# Patient Record
Sex: Female | Born: 1950 | Race: White | Hispanic: No | Marital: Married | Smoking: Never smoker
Health system: Southern US, Community
[De-identification: ages and names within clinical notes are randomized; demographics above are authoritative.]

## PROBLEM LIST (undated history)

## (undated) HISTORY — PX: AUGMENTATION MAMMAPLASTY: SUR837

---

## 1999-03-05 ENCOUNTER — Encounter: Admission: RE | Admit: 1999-03-05 | Discharge: 1999-03-05 | Payer: Self-pay | Admitting: Obstetrics and Gynecology

## 1999-03-05 ENCOUNTER — Other Ambulatory Visit: Admission: RE | Admit: 1999-03-05 | Discharge: 1999-03-05 | Payer: Self-pay | Admitting: Obstetrics and Gynecology

## 1999-03-05 ENCOUNTER — Encounter: Payer: Self-pay | Admitting: Obstetrics and Gynecology

## 2000-03-09 ENCOUNTER — Other Ambulatory Visit: Admission: RE | Admit: 2000-03-09 | Discharge: 2000-03-09 | Payer: Self-pay | Admitting: Obstetrics and Gynecology

## 2000-03-09 ENCOUNTER — Encounter: Payer: Self-pay | Admitting: Obstetrics and Gynecology

## 2000-03-09 ENCOUNTER — Encounter: Admission: RE | Admit: 2000-03-09 | Discharge: 2000-03-09 | Payer: Self-pay | Admitting: Obstetrics and Gynecology

## 2001-04-04 ENCOUNTER — Encounter: Admission: RE | Admit: 2001-04-04 | Discharge: 2001-04-04 | Payer: Self-pay | Admitting: Obstetrics and Gynecology

## 2001-04-04 ENCOUNTER — Encounter: Payer: Self-pay | Admitting: Obstetrics and Gynecology

## 2001-04-04 ENCOUNTER — Other Ambulatory Visit: Admission: RE | Admit: 2001-04-04 | Discharge: 2001-04-04 | Payer: Self-pay | Admitting: Obstetrics and Gynecology

## 2002-04-11 ENCOUNTER — Encounter: Payer: Self-pay | Admitting: Obstetrics and Gynecology

## 2002-04-11 ENCOUNTER — Encounter: Admission: RE | Admit: 2002-04-11 | Discharge: 2002-04-11 | Payer: Self-pay | Admitting: Obstetrics and Gynecology

## 2002-04-11 ENCOUNTER — Other Ambulatory Visit: Admission: RE | Admit: 2002-04-11 | Discharge: 2002-04-11 | Payer: Self-pay | Admitting: Obstetrics and Gynecology

## 2003-04-22 ENCOUNTER — Encounter: Admission: RE | Admit: 2003-04-22 | Discharge: 2003-04-22 | Payer: Self-pay | Admitting: Obstetrics and Gynecology

## 2003-04-24 ENCOUNTER — Other Ambulatory Visit: Admission: RE | Admit: 2003-04-24 | Discharge: 2003-04-24 | Payer: Self-pay | Admitting: Obstetrics and Gynecology

## 2004-04-28 ENCOUNTER — Other Ambulatory Visit: Admission: RE | Admit: 2004-04-28 | Discharge: 2004-04-28 | Payer: Self-pay | Admitting: Obstetrics and Gynecology

## 2004-04-28 ENCOUNTER — Ambulatory Visit (HOSPITAL_COMMUNITY): Admission: RE | Admit: 2004-04-28 | Discharge: 2004-04-28 | Payer: Self-pay | Admitting: Obstetrics and Gynecology

## 2005-04-21 ENCOUNTER — Other Ambulatory Visit: Admission: RE | Admit: 2005-04-21 | Discharge: 2005-04-21 | Payer: Self-pay | Admitting: Obstetrics and Gynecology

## 2005-04-21 ENCOUNTER — Encounter: Admission: RE | Admit: 2005-04-21 | Discharge: 2005-04-21 | Payer: Self-pay | Admitting: Obstetrics and Gynecology

## 2006-04-26 ENCOUNTER — Encounter: Admission: RE | Admit: 2006-04-26 | Discharge: 2006-04-26 | Payer: Self-pay | Admitting: Obstetrics and Gynecology

## 2008-02-12 ENCOUNTER — Ambulatory Visit (HOSPITAL_COMMUNITY): Admission: RE | Admit: 2008-02-12 | Discharge: 2008-02-12 | Payer: Self-pay | Admitting: Obstetrics and Gynecology

## 2009-03-12 ENCOUNTER — Ambulatory Visit (HOSPITAL_COMMUNITY): Admission: RE | Admit: 2009-03-12 | Discharge: 2009-03-12 | Payer: Self-pay | Admitting: Obstetrics and Gynecology

## 2010-03-15 ENCOUNTER — Ambulatory Visit (HOSPITAL_COMMUNITY): Admission: RE | Admit: 2010-03-15 | Discharge: 2010-03-15 | Payer: Self-pay | Admitting: Obstetrics and Gynecology

## 2012-04-30 ENCOUNTER — Other Ambulatory Visit: Payer: Self-pay | Admitting: Family Medicine

## 2012-04-30 DIAGNOSIS — R9389 Abnormal findings on diagnostic imaging of other specified body structures: Secondary | ICD-10-CM

## 2012-05-04 ENCOUNTER — Ambulatory Visit
Admission: RE | Admit: 2012-05-04 | Discharge: 2012-05-04 | Disposition: A | Discharge status: Home / Self Care | Payer: BC Managed Care – PPO | Source: Ambulatory Visit | Attending: Family Medicine | Admitting: Family Medicine

## 2012-05-04 DIAGNOSIS — R9389 Abnormal findings on diagnostic imaging of other specified body structures: Secondary | ICD-10-CM

## 2015-06-04 DIAGNOSIS — H5203 Hypermetropia, bilateral: Secondary | ICD-10-CM | POA: Diagnosis not present

## 2015-06-04 DIAGNOSIS — H524 Presbyopia: Secondary | ICD-10-CM | POA: Diagnosis not present

## 2015-06-04 DIAGNOSIS — H52223 Regular astigmatism, bilateral: Secondary | ICD-10-CM | POA: Diagnosis not present

## 2015-06-04 DIAGNOSIS — H35363 Drusen (degenerative) of macula, bilateral: Secondary | ICD-10-CM | POA: Diagnosis not present

## 2015-06-04 DIAGNOSIS — H4720 Unspecified optic atrophy: Secondary | ICD-10-CM | POA: Diagnosis not present

## 2015-09-28 DIAGNOSIS — H43391 Other vitreous opacities, right eye: Secondary | ICD-10-CM | POA: Diagnosis not present

## 2015-09-28 DIAGNOSIS — H43811 Vitreous degeneration, right eye: Secondary | ICD-10-CM | POA: Diagnosis not present

## 2016-02-15 ENCOUNTER — Other Ambulatory Visit: Payer: Self-pay | Admitting: Family Medicine

## 2016-02-15 ENCOUNTER — Other Ambulatory Visit (HOSPITAL_COMMUNITY)
Admission: RE | Admit: 2016-02-15 | Discharge: 2016-02-15 | Disposition: A | Discharge status: Home / Self Care | Payer: PPO | Source: Ambulatory Visit | Attending: Family Medicine | Admitting: Family Medicine

## 2016-02-15 DIAGNOSIS — Z1239 Encounter for other screening for malignant neoplasm of breast: Secondary | ICD-10-CM | POA: Diagnosis not present

## 2016-02-15 DIAGNOSIS — R87619 Unspecified abnormal cytological findings in specimens from cervix uteri: Secondary | ICD-10-CM | POA: Diagnosis not present

## 2016-02-15 DIAGNOSIS — Z0000 Encounter for general adult medical examination without abnormal findings: Secondary | ICD-10-CM | POA: Diagnosis not present

## 2016-02-15 DIAGNOSIS — M85852 Other specified disorders of bone density and structure, left thigh: Secondary | ICD-10-CM | POA: Diagnosis not present

## 2016-02-15 DIAGNOSIS — Z1389 Encounter for screening for other disorder: Secondary | ICD-10-CM | POA: Diagnosis not present

## 2016-02-15 DIAGNOSIS — E785 Hyperlipidemia, unspecified: Secondary | ICD-10-CM | POA: Diagnosis not present

## 2016-02-15 DIAGNOSIS — Z131 Encounter for screening for diabetes mellitus: Secondary | ICD-10-CM | POA: Diagnosis not present

## 2016-02-15 DIAGNOSIS — R21 Rash and other nonspecific skin eruption: Secondary | ICD-10-CM | POA: Diagnosis not present

## 2016-02-15 DIAGNOSIS — Z23 Encounter for immunization: Secondary | ICD-10-CM | POA: Diagnosis not present

## 2016-02-15 DIAGNOSIS — Z124 Encounter for screening for malignant neoplasm of cervix: Secondary | ICD-10-CM | POA: Diagnosis not present

## 2016-02-17 ENCOUNTER — Other Ambulatory Visit: Payer: Self-pay | Admitting: Family Medicine

## 2016-02-17 DIAGNOSIS — Z9882 Breast implant status: Secondary | ICD-10-CM

## 2016-02-17 DIAGNOSIS — Z1231 Encounter for screening mammogram for malignant neoplasm of breast: Secondary | ICD-10-CM

## 2016-02-18 DIAGNOSIS — Z23 Encounter for immunization: Secondary | ICD-10-CM | POA: Diagnosis not present

## 2016-02-18 DIAGNOSIS — E854 Organ-limited amyloidosis: Secondary | ICD-10-CM | POA: Diagnosis not present

## 2016-02-18 DIAGNOSIS — L309 Dermatitis, unspecified: Secondary | ICD-10-CM | POA: Diagnosis not present

## 2016-02-18 LAB — CYTOLOGY - PAP

## 2016-03-07 ENCOUNTER — Ambulatory Visit
Admission: RE | Admit: 2016-03-07 | Discharge: 2016-03-07 | Disposition: A | Discharge status: Home / Self Care | Payer: PPO | Source: Ambulatory Visit | Attending: Family Medicine | Admitting: Family Medicine

## 2016-03-07 DIAGNOSIS — Z1231 Encounter for screening mammogram for malignant neoplasm of breast: Secondary | ICD-10-CM | POA: Diagnosis not present

## 2016-03-07 DIAGNOSIS — Z9882 Breast implant status: Secondary | ICD-10-CM

## 2016-03-17 DIAGNOSIS — M85852 Other specified disorders of bone density and structure, left thigh: Secondary | ICD-10-CM | POA: Diagnosis not present

## 2016-03-17 DIAGNOSIS — M8588 Other specified disorders of bone density and structure, other site: Secondary | ICD-10-CM | POA: Diagnosis not present

## 2016-05-20 DIAGNOSIS — L905 Scar conditions and fibrosis of skin: Secondary | ICD-10-CM | POA: Diagnosis not present

## 2016-05-20 DIAGNOSIS — Z23 Encounter for immunization: Secondary | ICD-10-CM | POA: Diagnosis not present

## 2016-05-20 DIAGNOSIS — D485 Neoplasm of uncertain behavior of skin: Secondary | ICD-10-CM | POA: Diagnosis not present

## 2016-07-04 DIAGNOSIS — L280 Lichen simplex chronicus: Secondary | ICD-10-CM | POA: Diagnosis not present

## 2016-07-04 DIAGNOSIS — N952 Postmenopausal atrophic vaginitis: Secondary | ICD-10-CM | POA: Diagnosis not present

## 2016-07-04 DIAGNOSIS — R203 Hyperesthesia: Secondary | ICD-10-CM | POA: Diagnosis not present

## 2016-07-04 DIAGNOSIS — N950 Postmenopausal bleeding: Secondary | ICD-10-CM | POA: Diagnosis not present

## 2016-11-18 DIAGNOSIS — H52223 Regular astigmatism, bilateral: Secondary | ICD-10-CM | POA: Diagnosis not present

## 2016-11-18 DIAGNOSIS — H40059 Ocular hypertension, unspecified eye: Secondary | ICD-10-CM | POA: Diagnosis not present

## 2016-11-18 DIAGNOSIS — H33309 Unspecified retinal break, unspecified eye: Secondary | ICD-10-CM | POA: Diagnosis not present

## 2016-11-18 DIAGNOSIS — H5203 Hypermetropia, bilateral: Secondary | ICD-10-CM | POA: Diagnosis not present

## 2016-11-18 DIAGNOSIS — H43811 Vitreous degeneration, right eye: Secondary | ICD-10-CM | POA: Diagnosis not present

## 2016-11-18 DIAGNOSIS — H25813 Combined forms of age-related cataract, bilateral: Secondary | ICD-10-CM | POA: Diagnosis not present

## 2016-11-18 DIAGNOSIS — H40013 Open angle with borderline findings, low risk, bilateral: Secondary | ICD-10-CM | POA: Diagnosis not present

## 2016-11-18 DIAGNOSIS — H43391 Other vitreous opacities, right eye: Secondary | ICD-10-CM | POA: Diagnosis not present

## 2016-11-18 DIAGNOSIS — H35363 Drusen (degenerative) of macula, bilateral: Secondary | ICD-10-CM | POA: Diagnosis not present

## 2016-11-18 DIAGNOSIS — H40019 Open angle with borderline findings, low risk, unspecified eye: Secondary | ICD-10-CM | POA: Diagnosis not present

## 2016-11-18 DIAGNOSIS — H40053 Ocular hypertension, bilateral: Secondary | ICD-10-CM | POA: Diagnosis not present

## 2016-12-27 DIAGNOSIS — H52223 Regular astigmatism, bilateral: Secondary | ICD-10-CM | POA: Diagnosis not present

## 2016-12-27 DIAGNOSIS — H21553 Recession of chamber angle, bilateral: Secondary | ICD-10-CM | POA: Diagnosis not present

## 2016-12-27 DIAGNOSIS — H5203 Hypermetropia, bilateral: Secondary | ICD-10-CM | POA: Diagnosis not present

## 2017-02-08 DIAGNOSIS — Z23 Encounter for immunization: Secondary | ICD-10-CM | POA: Diagnosis not present

## 2017-02-24 ENCOUNTER — Other Ambulatory Visit: Payer: Self-pay | Admitting: Family Medicine

## 2017-02-24 ENCOUNTER — Other Ambulatory Visit (HOSPITAL_COMMUNITY)
Admission: RE | Admit: 2017-02-24 | Discharge: 2017-02-24 | Disposition: A | Discharge status: Home / Self Care | Payer: PPO | Source: Ambulatory Visit | Attending: Family Medicine | Admitting: Family Medicine

## 2017-02-24 DIAGNOSIS — Z131 Encounter for screening for diabetes mellitus: Secondary | ICD-10-CM | POA: Diagnosis not present

## 2017-02-24 DIAGNOSIS — Z124 Encounter for screening for malignant neoplasm of cervix: Secondary | ICD-10-CM | POA: Insufficient documentation

## 2017-02-24 DIAGNOSIS — E785 Hyperlipidemia, unspecified: Secondary | ICD-10-CM | POA: Diagnosis not present

## 2017-02-24 DIAGNOSIS — Z0000 Encounter for general adult medical examination without abnormal findings: Secondary | ICD-10-CM | POA: Diagnosis not present

## 2017-02-24 DIAGNOSIS — Z1159 Encounter for screening for other viral diseases: Secondary | ICD-10-CM | POA: Diagnosis not present

## 2017-02-24 DIAGNOSIS — R87619 Unspecified abnormal cytological findings in specimens from cervix uteri: Secondary | ICD-10-CM | POA: Diagnosis not present

## 2017-02-24 DIAGNOSIS — Z1389 Encounter for screening for other disorder: Secondary | ICD-10-CM | POA: Diagnosis not present

## 2017-02-24 DIAGNOSIS — M85852 Other specified disorders of bone density and structure, left thigh: Secondary | ICD-10-CM | POA: Diagnosis not present

## 2017-02-27 LAB — CYTOLOGY - PAP
Adequacy: ABSENT
Diagnosis: NEGATIVE

## 2017-03-01 ENCOUNTER — Other Ambulatory Visit: Payer: Self-pay | Admitting: Family Medicine

## 2017-03-01 DIAGNOSIS — Z1231 Encounter for screening mammogram for malignant neoplasm of breast: Secondary | ICD-10-CM

## 2017-03-20 ENCOUNTER — Ambulatory Visit
Admission: RE | Admit: 2017-03-20 | Discharge: 2017-03-20 | Disposition: A | Discharge status: Home / Self Care | Payer: PPO | Source: Ambulatory Visit | Attending: Family Medicine | Admitting: Family Medicine

## 2017-03-20 DIAGNOSIS — Z1231 Encounter for screening mammogram for malignant neoplasm of breast: Secondary | ICD-10-CM

## 2018-01-02 DIAGNOSIS — F439 Reaction to severe stress, unspecified: Secondary | ICD-10-CM | POA: Diagnosis not present

## 2018-01-02 DIAGNOSIS — D234 Other benign neoplasm of skin of scalp and neck: Secondary | ICD-10-CM | POA: Diagnosis not present

## 2018-02-02 DIAGNOSIS — H35361 Drusen (degenerative) of macula, right eye: Secondary | ICD-10-CM | POA: Diagnosis not present

## 2018-02-02 DIAGNOSIS — H353111 Nonexudative age-related macular degeneration, right eye, early dry stage: Secondary | ICD-10-CM | POA: Diagnosis not present

## 2018-02-02 DIAGNOSIS — H52223 Regular astigmatism, bilateral: Secondary | ICD-10-CM | POA: Diagnosis not present

## 2018-02-02 DIAGNOSIS — H3530 Unspecified macular degeneration: Secondary | ICD-10-CM | POA: Diagnosis not present

## 2018-02-02 DIAGNOSIS — H524 Presbyopia: Secondary | ICD-10-CM | POA: Diagnosis not present

## 2018-02-02 DIAGNOSIS — H353121 Nonexudative age-related macular degeneration, left eye, early dry stage: Secondary | ICD-10-CM | POA: Diagnosis not present

## 2018-02-02 DIAGNOSIS — H35362 Drusen (degenerative) of macula, left eye: Secondary | ICD-10-CM | POA: Diagnosis not present

## 2018-02-02 DIAGNOSIS — H5203 Hypermetropia, bilateral: Secondary | ICD-10-CM | POA: Diagnosis not present

## 2018-02-09 ENCOUNTER — Other Ambulatory Visit: Payer: Self-pay | Admitting: Family Medicine

## 2018-02-09 DIAGNOSIS — Z1231 Encounter for screening mammogram for malignant neoplasm of breast: Secondary | ICD-10-CM

## 2018-03-06 ENCOUNTER — Other Ambulatory Visit: Payer: Self-pay | Admitting: Family Medicine

## 2018-03-06 ENCOUNTER — Other Ambulatory Visit (HOSPITAL_COMMUNITY)
Admission: RE | Admit: 2018-03-06 | Discharge: 2018-03-06 | Disposition: A | Discharge status: Home / Self Care | Payer: PPO | Source: Ambulatory Visit | Attending: Family Medicine | Admitting: Family Medicine

## 2018-03-06 DIAGNOSIS — Z124 Encounter for screening for malignant neoplasm of cervix: Secondary | ICD-10-CM | POA: Diagnosis not present

## 2018-03-06 DIAGNOSIS — M79604 Pain in right leg: Secondary | ICD-10-CM | POA: Diagnosis not present

## 2018-03-06 DIAGNOSIS — Z0000 Encounter for general adult medical examination without abnormal findings: Secondary | ICD-10-CM | POA: Diagnosis not present

## 2018-03-06 DIAGNOSIS — M85851 Other specified disorders of bone density and structure, right thigh: Secondary | ICD-10-CM | POA: Diagnosis not present

## 2018-03-06 DIAGNOSIS — E785 Hyperlipidemia, unspecified: Secondary | ICD-10-CM | POA: Diagnosis not present

## 2018-03-06 DIAGNOSIS — D170 Benign lipomatous neoplasm of skin and subcutaneous tissue of head, face and neck: Secondary | ICD-10-CM | POA: Diagnosis not present

## 2018-03-06 DIAGNOSIS — R87619 Unspecified abnormal cytological findings in specimens from cervix uteri: Secondary | ICD-10-CM | POA: Diagnosis not present

## 2018-03-06 DIAGNOSIS — F439 Reaction to severe stress, unspecified: Secondary | ICD-10-CM | POA: Diagnosis not present

## 2018-03-07 ENCOUNTER — Other Ambulatory Visit: Payer: Self-pay | Admitting: Family Medicine

## 2018-03-07 DIAGNOSIS — M85851 Other specified disorders of bone density and structure, right thigh: Secondary | ICD-10-CM

## 2018-03-07 LAB — CYTOLOGY - PAP: Diagnosis: NEGATIVE

## 2018-03-21 ENCOUNTER — Ambulatory Visit: Payer: PPO

## 2018-03-21 ENCOUNTER — Ambulatory Visit
Admission: RE | Admit: 2018-03-21 | Discharge: 2018-03-21 | Disposition: A | Discharge status: Home / Self Care | Payer: PPO | Source: Ambulatory Visit | Attending: Family Medicine | Admitting: Family Medicine

## 2018-03-21 DIAGNOSIS — M8589 Other specified disorders of bone density and structure, multiple sites: Secondary | ICD-10-CM | POA: Diagnosis not present

## 2018-03-21 DIAGNOSIS — M85851 Other specified disorders of bone density and structure, right thigh: Secondary | ICD-10-CM

## 2018-03-21 DIAGNOSIS — Z1231 Encounter for screening mammogram for malignant neoplasm of breast: Secondary | ICD-10-CM

## 2018-03-21 DIAGNOSIS — Z780 Asymptomatic menopausal state: Secondary | ICD-10-CM | POA: Diagnosis not present

## 2018-03-23 DIAGNOSIS — H903 Sensorineural hearing loss, bilateral: Secondary | ICD-10-CM | POA: Diagnosis not present

## 2018-04-10 DIAGNOSIS — F439 Reaction to severe stress, unspecified: Secondary | ICD-10-CM | POA: Diagnosis not present

## 2018-05-08 DIAGNOSIS — F439 Reaction to severe stress, unspecified: Secondary | ICD-10-CM | POA: Diagnosis not present

## 2018-05-08 DIAGNOSIS — N952 Postmenopausal atrophic vaginitis: Secondary | ICD-10-CM | POA: Diagnosis not present

## 2018-05-08 DIAGNOSIS — Z131 Encounter for screening for diabetes mellitus: Secondary | ICD-10-CM | POA: Diagnosis not present

## 2018-05-08 DIAGNOSIS — M85851 Other specified disorders of bone density and structure, right thigh: Secondary | ICD-10-CM | POA: Diagnosis not present

## 2018-12-26 DIAGNOSIS — M25561 Pain in right knee: Secondary | ICD-10-CM | POA: Insufficient documentation

## 2018-12-27 DIAGNOSIS — M25561 Pain in right knee: Secondary | ICD-10-CM | POA: Diagnosis not present

## 2018-12-27 DIAGNOSIS — M1711 Unilateral primary osteoarthritis, right knee: Secondary | ICD-10-CM | POA: Diagnosis not present

## 2019-02-18 ENCOUNTER — Other Ambulatory Visit: Payer: Self-pay | Admitting: Family Medicine

## 2019-02-18 DIAGNOSIS — Z1231 Encounter for screening mammogram for malignant neoplasm of breast: Secondary | ICD-10-CM

## 2019-03-19 DIAGNOSIS — M1711 Unilateral primary osteoarthritis, right knee: Secondary | ICD-10-CM | POA: Diagnosis not present

## 2019-03-19 DIAGNOSIS — M25561 Pain in right knee: Secondary | ICD-10-CM | POA: Diagnosis not present

## 2019-03-26 DIAGNOSIS — M25561 Pain in right knee: Secondary | ICD-10-CM | POA: Diagnosis not present

## 2019-03-26 DIAGNOSIS — M1711 Unilateral primary osteoarthritis, right knee: Secondary | ICD-10-CM | POA: Diagnosis not present

## 2019-03-28 DIAGNOSIS — R7989 Other specified abnormal findings of blood chemistry: Secondary | ICD-10-CM | POA: Diagnosis not present

## 2019-03-28 DIAGNOSIS — F439 Reaction to severe stress, unspecified: Secondary | ICD-10-CM | POA: Diagnosis not present

## 2019-03-28 DIAGNOSIS — M85851 Other specified disorders of bone density and structure, right thigh: Secondary | ICD-10-CM | POA: Diagnosis not present

## 2019-03-28 DIAGNOSIS — E785 Hyperlipidemia, unspecified: Secondary | ICD-10-CM | POA: Diagnosis not present

## 2019-03-28 DIAGNOSIS — Z79899 Other long term (current) drug therapy: Secondary | ICD-10-CM | POA: Diagnosis not present

## 2019-03-28 DIAGNOSIS — N950 Postmenopausal bleeding: Secondary | ICD-10-CM | POA: Diagnosis not present

## 2019-03-28 DIAGNOSIS — Z0000 Encounter for general adult medical examination without abnormal findings: Secondary | ICD-10-CM | POA: Diagnosis not present

## 2019-03-29 DIAGNOSIS — N950 Postmenopausal bleeding: Secondary | ICD-10-CM | POA: Diagnosis not present

## 2019-04-02 DIAGNOSIS — M1711 Unilateral primary osteoarthritis, right knee: Secondary | ICD-10-CM | POA: Diagnosis not present

## 2019-04-02 DIAGNOSIS — M25561 Pain in right knee: Secondary | ICD-10-CM | POA: Diagnosis not present

## 2019-04-03 ENCOUNTER — Ambulatory Visit
Admission: RE | Admit: 2019-04-03 | Discharge: 2019-04-03 | Disposition: A | Discharge status: Home / Self Care | Payer: PPO | Source: Ambulatory Visit | Attending: Family Medicine | Admitting: Family Medicine

## 2019-04-03 ENCOUNTER — Other Ambulatory Visit: Payer: Self-pay

## 2019-04-03 DIAGNOSIS — Z1231 Encounter for screening mammogram for malignant neoplasm of breast: Secondary | ICD-10-CM

## 2019-04-08 ENCOUNTER — Other Ambulatory Visit: Payer: Self-pay | Admitting: Obstetrics and Gynecology

## 2019-04-08 DIAGNOSIS — N950 Postmenopausal bleeding: Secondary | ICD-10-CM | POA: Diagnosis not present

## 2019-04-08 DIAGNOSIS — N393 Stress incontinence (female) (male): Secondary | ICD-10-CM | POA: Diagnosis not present

## 2019-04-08 DIAGNOSIS — D259 Leiomyoma of uterus, unspecified: Secondary | ICD-10-CM | POA: Diagnosis not present

## 2019-04-23 DIAGNOSIS — D259 Leiomyoma of uterus, unspecified: Secondary | ICD-10-CM | POA: Diagnosis not present

## 2019-04-23 DIAGNOSIS — R32 Unspecified urinary incontinence: Secondary | ICD-10-CM | POA: Diagnosis not present

## 2019-04-23 DIAGNOSIS — N950 Postmenopausal bleeding: Secondary | ICD-10-CM | POA: Diagnosis not present

## 2019-05-05 DIAGNOSIS — Z20828 Contact with and (suspected) exposure to other viral communicable diseases: Secondary | ICD-10-CM | POA: Diagnosis not present

## 2019-07-04 DIAGNOSIS — M25561 Pain in right knee: Secondary | ICD-10-CM | POA: Diagnosis not present

## 2019-07-12 DIAGNOSIS — M25561 Pain in right knee: Secondary | ICD-10-CM | POA: Diagnosis not present

## 2019-07-23 DIAGNOSIS — M25561 Pain in right knee: Secondary | ICD-10-CM | POA: Diagnosis not present

## 2020-01-07 DIAGNOSIS — M85851 Other specified disorders of bone density and structure, right thigh: Secondary | ICD-10-CM | POA: Diagnosis not present

## 2020-01-07 DIAGNOSIS — Z01818 Encounter for other preprocedural examination: Secondary | ICD-10-CM | POA: Diagnosis not present

## 2020-01-07 DIAGNOSIS — Z79899 Other long term (current) drug therapy: Secondary | ICD-10-CM | POA: Diagnosis not present

## 2020-01-07 DIAGNOSIS — M1711 Unilateral primary osteoarthritis, right knee: Secondary | ICD-10-CM | POA: Diagnosis not present

## 2020-01-29 DIAGNOSIS — Z01818 Encounter for other preprocedural examination: Secondary | ICD-10-CM | POA: Diagnosis not present

## 2020-02-03 DIAGNOSIS — M25561 Pain in right knee: Secondary | ICD-10-CM | POA: Diagnosis not present

## 2020-02-03 DIAGNOSIS — M1711 Unilateral primary osteoarthritis, right knee: Secondary | ICD-10-CM | POA: Diagnosis not present

## 2020-02-03 DIAGNOSIS — Z96651 Presence of right artificial knee joint: Secondary | ICD-10-CM | POA: Diagnosis not present

## 2020-02-03 DIAGNOSIS — G8918 Other acute postprocedural pain: Secondary | ICD-10-CM | POA: Diagnosis not present

## 2020-02-05 DIAGNOSIS — M25561 Pain in right knee: Secondary | ICD-10-CM | POA: Diagnosis not present

## 2020-02-05 DIAGNOSIS — Z96651 Presence of right artificial knee joint: Secondary | ICD-10-CM | POA: Diagnosis not present

## 2020-02-05 DIAGNOSIS — M6281 Muscle weakness (generalized): Secondary | ICD-10-CM | POA: Diagnosis not present

## 2020-02-05 DIAGNOSIS — R2689 Other abnormalities of gait and mobility: Secondary | ICD-10-CM | POA: Diagnosis not present

## 2020-02-06 DIAGNOSIS — M25561 Pain in right knee: Secondary | ICD-10-CM | POA: Diagnosis not present

## 2020-02-06 DIAGNOSIS — R2689 Other abnormalities of gait and mobility: Secondary | ICD-10-CM | POA: Diagnosis not present

## 2020-02-06 DIAGNOSIS — M6281 Muscle weakness (generalized): Secondary | ICD-10-CM | POA: Diagnosis not present

## 2020-02-06 DIAGNOSIS — Z96651 Presence of right artificial knee joint: Secondary | ICD-10-CM | POA: Diagnosis not present

## 2020-02-10 DIAGNOSIS — M6281 Muscle weakness (generalized): Secondary | ICD-10-CM | POA: Diagnosis not present

## 2020-02-10 DIAGNOSIS — R2689 Other abnormalities of gait and mobility: Secondary | ICD-10-CM | POA: Diagnosis not present

## 2020-02-10 DIAGNOSIS — Z96651 Presence of right artificial knee joint: Secondary | ICD-10-CM | POA: Diagnosis not present

## 2020-02-10 DIAGNOSIS — M25561 Pain in right knee: Secondary | ICD-10-CM | POA: Diagnosis not present

## 2020-02-12 DIAGNOSIS — Z96651 Presence of right artificial knee joint: Secondary | ICD-10-CM | POA: Diagnosis not present

## 2020-02-12 DIAGNOSIS — R2689 Other abnormalities of gait and mobility: Secondary | ICD-10-CM | POA: Diagnosis not present

## 2020-02-12 DIAGNOSIS — M25561 Pain in right knee: Secondary | ICD-10-CM | POA: Diagnosis not present

## 2020-02-12 DIAGNOSIS — M6281 Muscle weakness (generalized): Secondary | ICD-10-CM | POA: Diagnosis not present

## 2020-02-13 DIAGNOSIS — Z4789 Encounter for other orthopedic aftercare: Secondary | ICD-10-CM | POA: Diagnosis not present

## 2020-02-14 DIAGNOSIS — M6281 Muscle weakness (generalized): Secondary | ICD-10-CM | POA: Diagnosis not present

## 2020-02-14 DIAGNOSIS — R2689 Other abnormalities of gait and mobility: Secondary | ICD-10-CM | POA: Diagnosis not present

## 2020-02-14 DIAGNOSIS — Z96651 Presence of right artificial knee joint: Secondary | ICD-10-CM | POA: Diagnosis not present

## 2020-02-14 DIAGNOSIS — M25561 Pain in right knee: Secondary | ICD-10-CM | POA: Diagnosis not present

## 2020-02-17 DIAGNOSIS — M6281 Muscle weakness (generalized): Secondary | ICD-10-CM | POA: Diagnosis not present

## 2020-02-17 DIAGNOSIS — Z96651 Presence of right artificial knee joint: Secondary | ICD-10-CM | POA: Diagnosis not present

## 2020-02-17 DIAGNOSIS — R2689 Other abnormalities of gait and mobility: Secondary | ICD-10-CM | POA: Diagnosis not present

## 2020-02-17 DIAGNOSIS — M25561 Pain in right knee: Secondary | ICD-10-CM | POA: Diagnosis not present

## 2020-02-20 DIAGNOSIS — M6281 Muscle weakness (generalized): Secondary | ICD-10-CM | POA: Diagnosis not present

## 2020-02-20 DIAGNOSIS — M25561 Pain in right knee: Secondary | ICD-10-CM | POA: Diagnosis not present

## 2020-02-20 DIAGNOSIS — R2689 Other abnormalities of gait and mobility: Secondary | ICD-10-CM | POA: Diagnosis not present

## 2020-02-20 DIAGNOSIS — Z96651 Presence of right artificial knee joint: Secondary | ICD-10-CM | POA: Diagnosis not present

## 2020-02-24 ENCOUNTER — Other Ambulatory Visit: Payer: Self-pay | Admitting: Family Medicine

## 2020-02-24 DIAGNOSIS — Z1231 Encounter for screening mammogram for malignant neoplasm of breast: Secondary | ICD-10-CM

## 2020-02-25 DIAGNOSIS — R2689 Other abnormalities of gait and mobility: Secondary | ICD-10-CM | POA: Diagnosis not present

## 2020-02-25 DIAGNOSIS — M6281 Muscle weakness (generalized): Secondary | ICD-10-CM | POA: Diagnosis not present

## 2020-02-25 DIAGNOSIS — M25561 Pain in right knee: Secondary | ICD-10-CM | POA: Diagnosis not present

## 2020-02-25 DIAGNOSIS — Z96651 Presence of right artificial knee joint: Secondary | ICD-10-CM | POA: Diagnosis not present

## 2020-02-27 DIAGNOSIS — M6281 Muscle weakness (generalized): Secondary | ICD-10-CM | POA: Diagnosis not present

## 2020-02-27 DIAGNOSIS — M25561 Pain in right knee: Secondary | ICD-10-CM | POA: Diagnosis not present

## 2020-02-27 DIAGNOSIS — R2689 Other abnormalities of gait and mobility: Secondary | ICD-10-CM | POA: Diagnosis not present

## 2020-02-27 DIAGNOSIS — Z96651 Presence of right artificial knee joint: Secondary | ICD-10-CM | POA: Diagnosis not present

## 2020-03-02 DIAGNOSIS — M25561 Pain in right knee: Secondary | ICD-10-CM | POA: Diagnosis not present

## 2020-03-02 DIAGNOSIS — R2689 Other abnormalities of gait and mobility: Secondary | ICD-10-CM | POA: Diagnosis not present

## 2020-03-02 DIAGNOSIS — M6281 Muscle weakness (generalized): Secondary | ICD-10-CM | POA: Diagnosis not present

## 2020-03-02 DIAGNOSIS — Z96651 Presence of right artificial knee joint: Secondary | ICD-10-CM | POA: Diagnosis not present

## 2020-03-05 DIAGNOSIS — Z96651 Presence of right artificial knee joint: Secondary | ICD-10-CM | POA: Diagnosis not present

## 2020-03-05 DIAGNOSIS — R2689 Other abnormalities of gait and mobility: Secondary | ICD-10-CM | POA: Diagnosis not present

## 2020-03-05 DIAGNOSIS — M6281 Muscle weakness (generalized): Secondary | ICD-10-CM | POA: Diagnosis not present

## 2020-03-05 DIAGNOSIS — M25561 Pain in right knee: Secondary | ICD-10-CM | POA: Diagnosis not present

## 2020-03-09 DIAGNOSIS — Z96651 Presence of right artificial knee joint: Secondary | ICD-10-CM | POA: Diagnosis not present

## 2020-03-09 DIAGNOSIS — M6281 Muscle weakness (generalized): Secondary | ICD-10-CM | POA: Diagnosis not present

## 2020-03-09 DIAGNOSIS — M25561 Pain in right knee: Secondary | ICD-10-CM | POA: Diagnosis not present

## 2020-03-09 DIAGNOSIS — R2689 Other abnormalities of gait and mobility: Secondary | ICD-10-CM | POA: Diagnosis not present

## 2020-03-11 DIAGNOSIS — R2689 Other abnormalities of gait and mobility: Secondary | ICD-10-CM | POA: Diagnosis not present

## 2020-03-11 DIAGNOSIS — M6281 Muscle weakness (generalized): Secondary | ICD-10-CM | POA: Diagnosis not present

## 2020-03-11 DIAGNOSIS — M25561 Pain in right knee: Secondary | ICD-10-CM | POA: Diagnosis not present

## 2020-03-11 DIAGNOSIS — Z96651 Presence of right artificial knee joint: Secondary | ICD-10-CM | POA: Diagnosis not present

## 2020-03-17 ENCOUNTER — Ambulatory Visit: Payer: Self-pay | Admitting: Podiatry

## 2020-03-26 ENCOUNTER — Ambulatory Visit: Payer: PPO | Admitting: Podiatry

## 2020-03-26 ENCOUNTER — Encounter: Payer: Self-pay | Admitting: Podiatry

## 2020-03-26 ENCOUNTER — Other Ambulatory Visit: Payer: Self-pay

## 2020-03-26 DIAGNOSIS — N952 Postmenopausal atrophic vaginitis: Secondary | ICD-10-CM | POA: Insufficient documentation

## 2020-03-26 DIAGNOSIS — L989 Disorder of the skin and subcutaneous tissue, unspecified: Secondary | ICD-10-CM

## 2020-03-26 DIAGNOSIS — L603 Nail dystrophy: Secondary | ICD-10-CM

## 2020-03-26 DIAGNOSIS — L608 Other nail disorders: Secondary | ICD-10-CM | POA: Diagnosis not present

## 2020-03-26 DIAGNOSIS — N941 Unspecified dyspareunia: Secondary | ICD-10-CM | POA: Insufficient documentation

## 2020-03-26 DIAGNOSIS — N950 Postmenopausal bleeding: Secondary | ICD-10-CM | POA: Insufficient documentation

## 2020-03-26 DIAGNOSIS — M2012 Hallux valgus (acquired), left foot: Secondary | ICD-10-CM

## 2020-03-26 NOTE — Progress Notes (Signed)
  Subjective:  Patient ID: Briana Stewart, female    DOB: 01-06-1951,  MRN: [id number] HPI Chief Complaint  Patient presents with  . Foot Pain    Plantar forefoot left - small, callused areas x 2 x several years, tried trimming, treated years ago  . Nail Problem    Hallux left - thick, dark toenail at the tip  . Foot Pain    Questions regarding bunion  . New Patient (Initial Visit)    69 y.o. female presents with the above complaint.   ROS: Denies fever chills nausea vomiting muscle aches pains calf pain back pain chest pain shortness of breath.  No past medical history on file.  No current outpatient medications on file.  No Known Allergies Review of Systems Objective:  There were no vitals filed for this visit.  General: Well developed, nourished, in no acute distress, alert and oriented x3   Dermatological: Skin is warm, dry and supple bilateral. Nails x 10 are well maintained then the hallux nails do demonstrate what appears to be slight nail dystrophy.; remaining integument appears unremarkable at this time. There are no open sores, no preulcerative lesions, no rash or signs of infection present.  Porokeratosis plantar aspect forefoot left sharply debrided by patient I debrided multiple others.  Chemical destruction of a single lesion.  Vascular: Dorsalis Pedis artery and Posterior Tibial artery pedal pulses are 2/4 bilateral with immedate capillary fill time. Pedal hair growth present. No varicosities and no lower extremity edema present bilateral.   Neruologic: Grossly intact via light touch bilateral. Vibratory intact via tuning fork bilateral. Protective threshold with Semmes Wienstein monofilament intact to all pedal sites bilateral. Patellar and Achilles deep tendon reflexes 2+ bilateral. No Babinski or clonus noted bilateral.   Musculoskeletal: No gross boney pedal deformities bilateral. No pain, crepitus, or limitation noted with foot and ankle range of motion  bilateral. Muscular strength 5/5 in all groups tested bilateral.  Mild hallux abductovalgus deformity bilaterally.  No limited range of motion of pain on palpation.  Gait: Unassisted, Nonantalgic.    Radiographs:    Assessment & Plan:   Assessment: Mild bunion deformity bilaterally left greater than right.  Benign hyperkeratotic lesion plantar aspect left foot.  Nail dystrophy hallux left.  Plan: Mechanical and chemical debridement of benign lesion left foot Cantharone placed under occlusion to be washed off thoroughly tomorrow.  She understands this is amenable to it.  Also took samples of skin and nail to be sent for pathologic evaluation.  Discussed the pros and cons of bunion surgery.     Karah Caruthers T. [place], [place]

## 2020-04-06 ENCOUNTER — Ambulatory Visit
Admission: RE | Admit: 2020-04-06 | Discharge: 2020-04-06 | Disposition: A | Discharge status: Home / Self Care | Payer: PPO | Source: Ambulatory Visit | Attending: Family Medicine | Admitting: Family Medicine

## 2020-04-06 ENCOUNTER — Other Ambulatory Visit: Payer: Self-pay

## 2020-04-06 DIAGNOSIS — Z1231 Encounter for screening mammogram for malignant neoplasm of breast: Secondary | ICD-10-CM

## 2020-04-07 DIAGNOSIS — Z1389 Encounter for screening for other disorder: Secondary | ICD-10-CM | POA: Diagnosis not present

## 2020-04-07 DIAGNOSIS — N952 Postmenopausal atrophic vaginitis: Secondary | ICD-10-CM | POA: Diagnosis not present

## 2020-04-07 DIAGNOSIS — Z0000 Encounter for general adult medical examination without abnormal findings: Secondary | ICD-10-CM | POA: Diagnosis not present

## 2020-04-07 DIAGNOSIS — M85851 Other specified disorders of bone density and structure, right thigh: Secondary | ICD-10-CM | POA: Diagnosis not present

## 2020-04-07 DIAGNOSIS — M1711 Unilateral primary osteoarthritis, right knee: Secondary | ICD-10-CM | POA: Diagnosis not present

## 2020-04-07 DIAGNOSIS — F411 Generalized anxiety disorder: Secondary | ICD-10-CM | POA: Diagnosis not present

## 2020-04-30 ENCOUNTER — Ambulatory Visit: Payer: PPO | Admitting: Podiatry

## 2020-05-05 ENCOUNTER — Ambulatory Visit: Payer: PPO | Admitting: Podiatry

## 2020-05-19 ENCOUNTER — Ambulatory Visit (INDEPENDENT_AMBULATORY_CARE_PROVIDER_SITE_OTHER): Payer: PPO | Admitting: Podiatry

## 2020-05-19 ENCOUNTER — Encounter: Payer: Self-pay | Admitting: Podiatry

## 2020-05-19 ENCOUNTER — Other Ambulatory Visit: Payer: Self-pay

## 2020-05-19 DIAGNOSIS — D2372 Other benign neoplasm of skin of left lower limb, including hip: Secondary | ICD-10-CM | POA: Diagnosis not present

## 2020-05-20 NOTE — Progress Notes (Signed)
She presents today for follow-up of her pathology.  She is also concerned about more reactive hyperkeratotic lesions to the plantar aspect of the foot.  Objective: Vital signs are stable she alert oriented x3 no change in findings.  Though she does have a area of porokeratosis to the medial aspect of the second metatarsal.  Possibly associated with friction as that there are 4 small lesions in a line.  This could be associated with her right knee surgery.  Pathology results demonstrate negative fungus.  Assessment: Nail dystrophy negative fungus hallux left.  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  Benign skin lesion.  Plan: Debrided reactive hyperkeratotic tissue today we will follow-up with her on an as-needed basis.  I did place salicylic acid under occlusion for this benign skin lesion she will leave this on for the next 2 to 3 days without getting it wet notify me if any questions or concerns.

## 2020-08-04 DIAGNOSIS — Z4789 Encounter for other orthopedic aftercare: Secondary | ICD-10-CM | POA: Diagnosis not present

## 2020-08-04 DIAGNOSIS — Z96659 Presence of unspecified artificial knee joint: Secondary | ICD-10-CM | POA: Diagnosis not present

## 2021-02-18 ENCOUNTER — Other Ambulatory Visit: Payer: Self-pay | Admitting: Family Medicine

## 2021-02-18 DIAGNOSIS — M8580 Other specified disorders of bone density and structure, unspecified site: Secondary | ICD-10-CM

## 2021-02-18 DIAGNOSIS — Z1231 Encounter for screening mammogram for malignant neoplasm of breast: Secondary | ICD-10-CM

## 2021-02-18 DIAGNOSIS — Z96651 Presence of right artificial knee joint: Secondary | ICD-10-CM | POA: Diagnosis not present

## 2021-02-22 DIAGNOSIS — M542 Cervicalgia: Secondary | ICD-10-CM | POA: Diagnosis not present

## 2021-02-24 DIAGNOSIS — M542 Cervicalgia: Secondary | ICD-10-CM | POA: Diagnosis not present

## 2021-03-02 DIAGNOSIS — M542 Cervicalgia: Secondary | ICD-10-CM | POA: Diagnosis not present

## 2021-03-04 DIAGNOSIS — M542 Cervicalgia: Secondary | ICD-10-CM | POA: Diagnosis not present

## 2021-03-07 IMAGING — MG DIGITAL SCREENING BREAST BILAT IMPLANT W/ TOMO W/ CAD
8 of 14 series · 8 of 34 positions shown · non-contrast
Comparison: Previous exam(s).

CLINICAL DATA: Screening.

EXAM:
DIGITAL SCREENING BILATERAL MAMMOGRAM WITH IMPLANTS, CAD AND TOMO
The patient has retroglandular implants. Standard and implant
displaced views were performed.

[R CC]
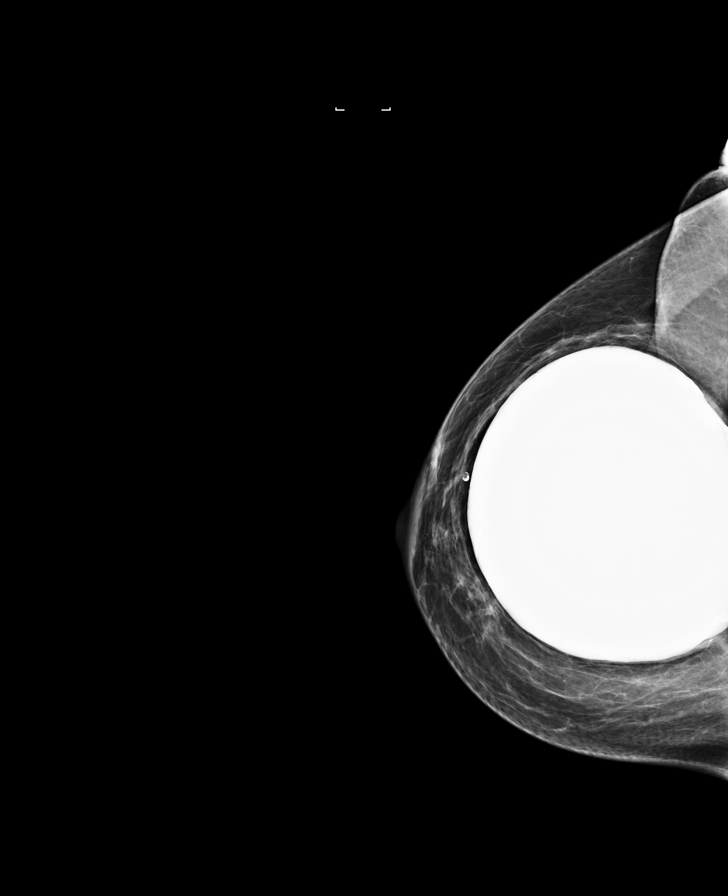

[R MLO]
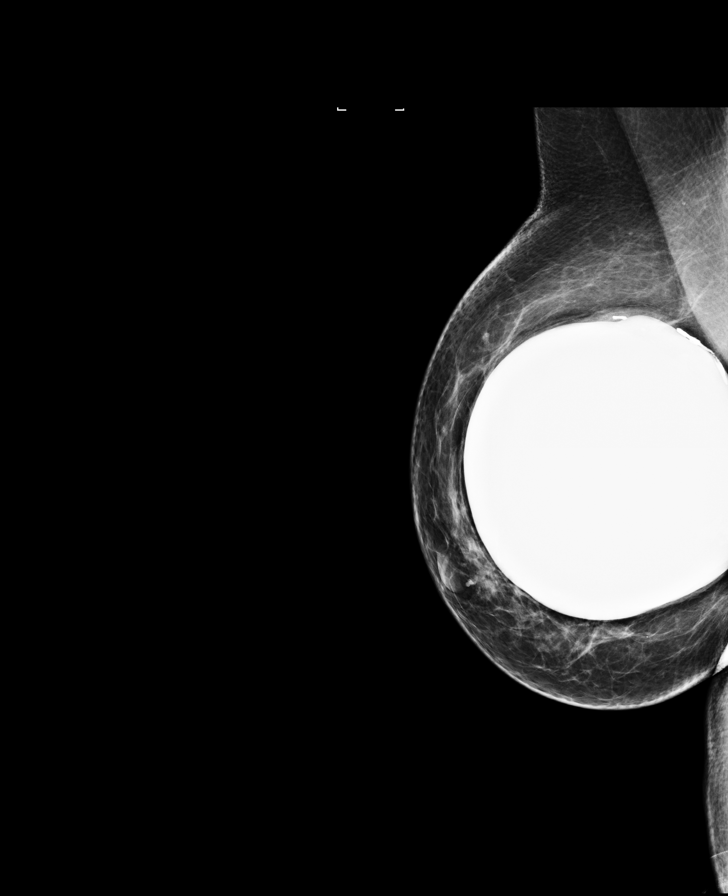

[L CC]
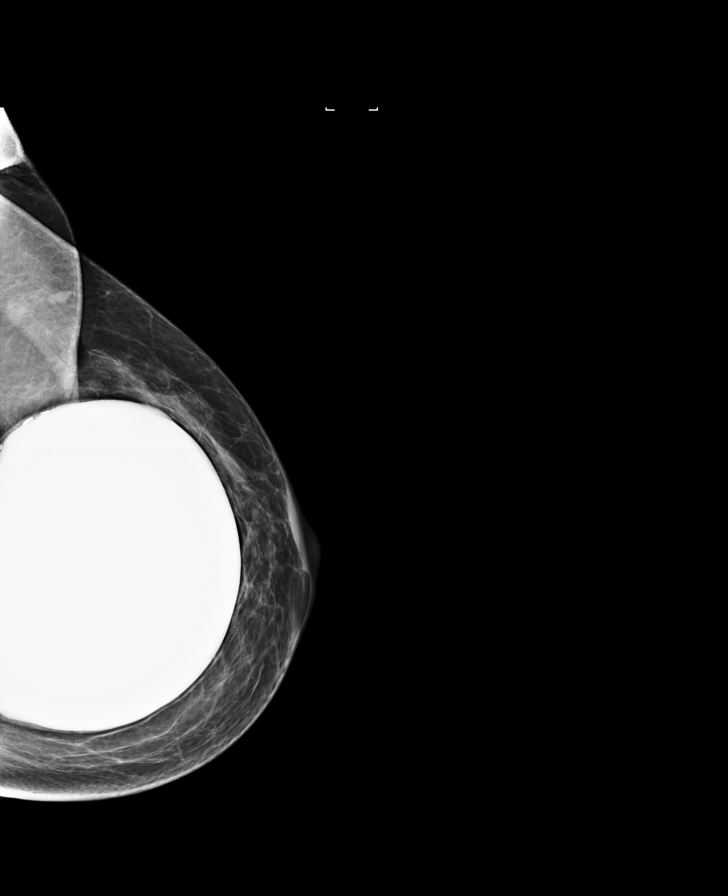

[L MLO]
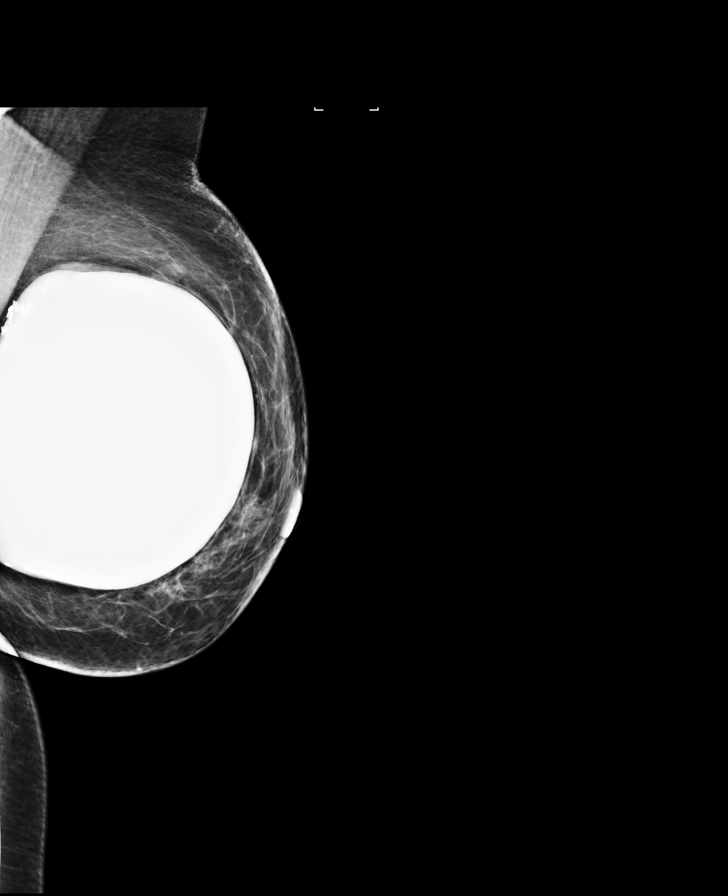

[R MLO synth-2D]
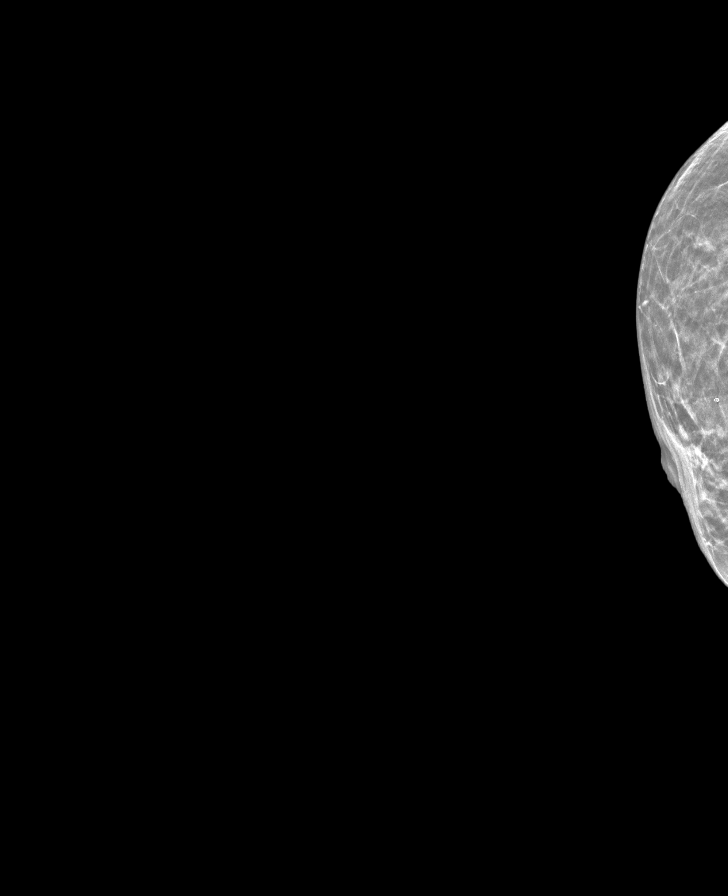

[L CC synth-2D]
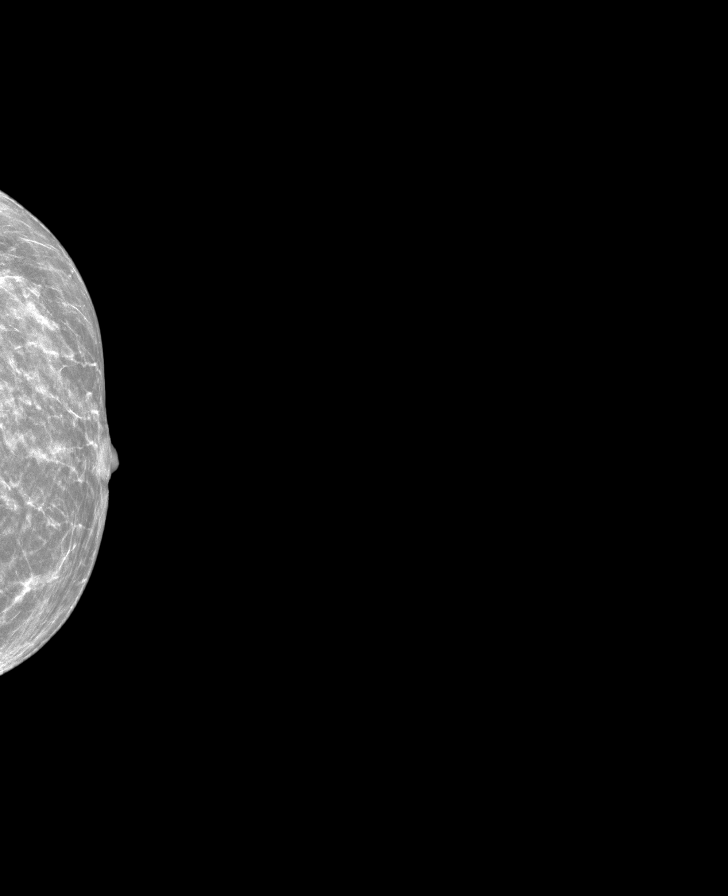

[R CC synth-2D]
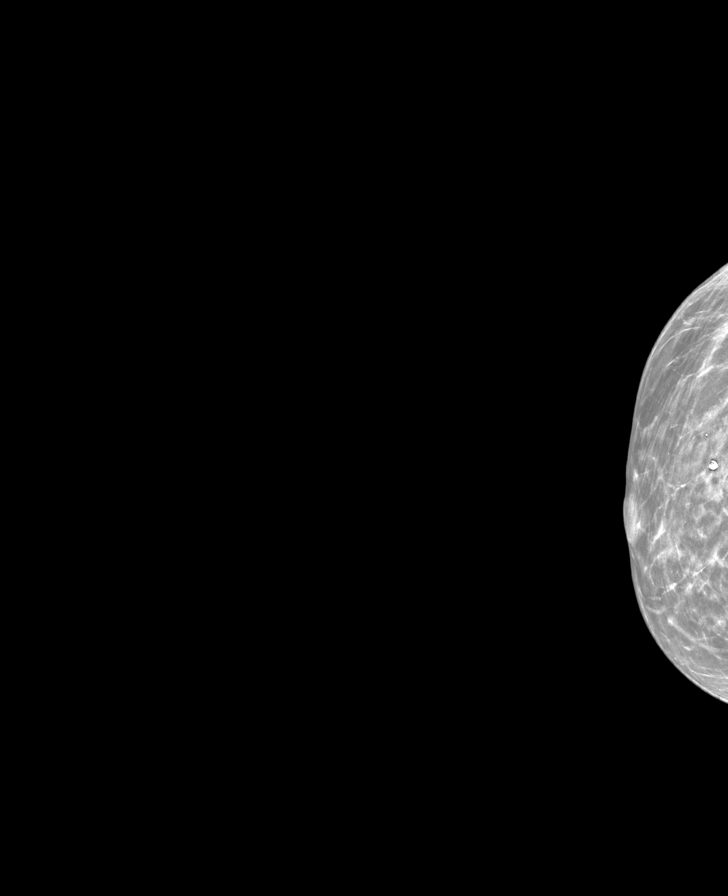

[L MLO synth-2D]
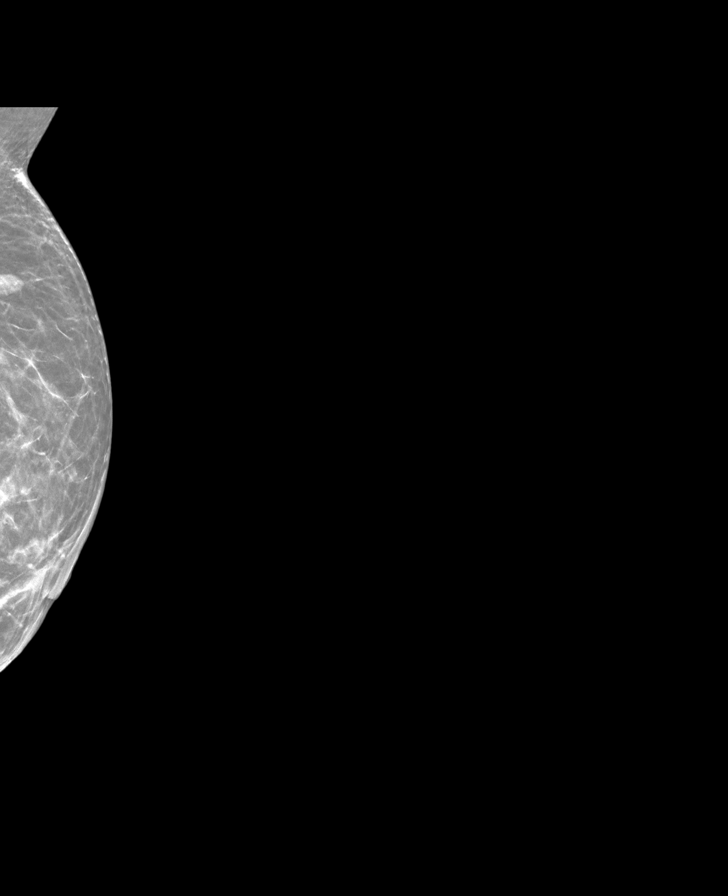

[8 of 34 positions shown; findings below may reference images not displayed]

ACR Breast Density Category b: There are scattered areas of
fibroglandular density.
FINDINGS: There are no findings suspicious for malignancy. Images were
processed with CAD.
IMPRESSION: No mammographic evidence of malignancy. A result letter of this
screening mammogram will be mailed directly to the patient.

RECOMMENDATION:
Screening mammogram in one year. (Code:VZ-A-PDF)

BI-RADS CATEGORY  1:  Negative.

## 2021-03-09 DIAGNOSIS — M542 Cervicalgia: Secondary | ICD-10-CM | POA: Diagnosis not present

## 2021-03-18 DIAGNOSIS — M542 Cervicalgia: Secondary | ICD-10-CM | POA: Diagnosis not present

## 2021-04-06 DIAGNOSIS — H5203 Hypermetropia, bilateral: Secondary | ICD-10-CM | POA: Diagnosis not present

## 2021-04-06 DIAGNOSIS — H35363 Drusen (degenerative) of macula, bilateral: Secondary | ICD-10-CM | POA: Diagnosis not present

## 2021-04-06 DIAGNOSIS — H3530 Unspecified macular degeneration: Secondary | ICD-10-CM | POA: Diagnosis not present

## 2021-04-06 DIAGNOSIS — H52223 Regular astigmatism, bilateral: Secondary | ICD-10-CM | POA: Diagnosis not present

## 2021-04-06 DIAGNOSIS — H524 Presbyopia: Secondary | ICD-10-CM | POA: Diagnosis not present

## 2021-04-08 ENCOUNTER — Ambulatory Visit
Admission: RE | Admit: 2021-04-08 | Discharge: 2021-04-08 | Disposition: A | Discharge status: Home / Self Care | Payer: PPO | Source: Ambulatory Visit | Attending: Family Medicine | Admitting: Family Medicine

## 2021-04-08 DIAGNOSIS — Z1231 Encounter for screening mammogram for malignant neoplasm of breast: Secondary | ICD-10-CM | POA: Diagnosis not present

## 2021-04-20 DIAGNOSIS — M85851 Other specified disorders of bone density and structure, right thigh: Secondary | ICD-10-CM | POA: Diagnosis not present

## 2021-04-22 DIAGNOSIS — F411 Generalized anxiety disorder: Secondary | ICD-10-CM | POA: Diagnosis not present

## 2021-04-22 DIAGNOSIS — M85851 Other specified disorders of bone density and structure, right thigh: Secondary | ICD-10-CM | POA: Diagnosis not present

## 2021-04-22 DIAGNOSIS — Z0000 Encounter for general adult medical examination without abnormal findings: Secondary | ICD-10-CM | POA: Diagnosis not present

## 2021-04-22 DIAGNOSIS — Z01419 Encounter for gynecological examination (general) (routine) without abnormal findings: Secondary | ICD-10-CM | POA: Diagnosis not present

## 2021-08-10 ENCOUNTER — Ambulatory Visit
Admission: RE | Admit: 2021-08-10 | Discharge: 2021-08-10 | Disposition: A | Discharge status: Home / Self Care | Payer: PPO | Source: Ambulatory Visit | Attending: Family Medicine | Admitting: Family Medicine

## 2021-08-10 DIAGNOSIS — M8589 Other specified disorders of bone density and structure, multiple sites: Secondary | ICD-10-CM | POA: Diagnosis not present

## 2021-08-10 DIAGNOSIS — M8580 Other specified disorders of bone density and structure, unspecified site: Secondary | ICD-10-CM

## 2021-08-10 DIAGNOSIS — Z780 Asymptomatic menopausal state: Secondary | ICD-10-CM | POA: Diagnosis not present

## 2021-08-24 DIAGNOSIS — M85851 Other specified disorders of bone density and structure, right thigh: Secondary | ICD-10-CM | POA: Diagnosis not present

## 2021-09-01 DIAGNOSIS — J029 Acute pharyngitis, unspecified: Secondary | ICD-10-CM | POA: Diagnosis not present

## 2021-09-01 DIAGNOSIS — Z20822 Contact with and (suspected) exposure to covid-19: Secondary | ICD-10-CM | POA: Diagnosis not present

## 2021-09-01 DIAGNOSIS — Z03818 Encounter for observation for suspected exposure to other biological agents ruled out: Secondary | ICD-10-CM | POA: Diagnosis not present

## 2021-09-06 DIAGNOSIS — B349 Viral infection, unspecified: Secondary | ICD-10-CM | POA: Diagnosis not present

## 2022-02-08 DIAGNOSIS — L309 Dermatitis, unspecified: Secondary | ICD-10-CM | POA: Diagnosis not present

## 2022-02-08 DIAGNOSIS — L814 Other melanin hyperpigmentation: Secondary | ICD-10-CM | POA: Diagnosis not present

## 2022-02-08 DIAGNOSIS — D229 Melanocytic nevi, unspecified: Secondary | ICD-10-CM | POA: Diagnosis not present

## 2022-02-08 DIAGNOSIS — R202 Paresthesia of skin: Secondary | ICD-10-CM | POA: Diagnosis not present

## 2022-02-08 DIAGNOSIS — L7211 Pilar cyst: Secondary | ICD-10-CM | POA: Diagnosis not present

## 2022-02-08 DIAGNOSIS — L821 Other seborrheic keratosis: Secondary | ICD-10-CM | POA: Diagnosis not present

## 2022-02-08 DIAGNOSIS — L570 Actinic keratosis: Secondary | ICD-10-CM | POA: Diagnosis not present

## 2022-02-08 DIAGNOSIS — L84 Corns and callosities: Secondary | ICD-10-CM | POA: Diagnosis not present

## 2022-02-08 DIAGNOSIS — L578 Other skin changes due to chronic exposure to nonionizing radiation: Secondary | ICD-10-CM | POA: Diagnosis not present

## 2022-02-15 DIAGNOSIS — Z96651 Presence of right artificial knee joint: Secondary | ICD-10-CM | POA: Diagnosis not present

## 2022-02-28 ENCOUNTER — Other Ambulatory Visit: Payer: Self-pay | Admitting: Family Medicine

## 2022-02-28 DIAGNOSIS — Z1231 Encounter for screening mammogram for malignant neoplasm of breast: Secondary | ICD-10-CM

## 2022-03-14 DIAGNOSIS — L7211 Pilar cyst: Secondary | ICD-10-CM | POA: Diagnosis not present

## 2022-03-23 DIAGNOSIS — M8580 Other specified disorders of bone density and structure, unspecified site: Secondary | ICD-10-CM | POA: Diagnosis not present

## 2022-03-23 DIAGNOSIS — F411 Generalized anxiety disorder: Secondary | ICD-10-CM | POA: Diagnosis not present

## 2022-03-23 DIAGNOSIS — F419 Anxiety disorder, unspecified: Secondary | ICD-10-CM | POA: Diagnosis not present

## 2022-03-23 DIAGNOSIS — E785 Hyperlipidemia, unspecified: Secondary | ICD-10-CM | POA: Diagnosis not present

## 2022-03-23 DIAGNOSIS — M1990 Unspecified osteoarthritis, unspecified site: Secondary | ICD-10-CM | POA: Diagnosis not present

## 2022-04-14 ENCOUNTER — Encounter: Payer: Self-pay | Admitting: Podiatry

## 2022-04-14 ENCOUNTER — Ambulatory Visit (INDEPENDENT_AMBULATORY_CARE_PROVIDER_SITE_OTHER): Payer: PPO | Admitting: Podiatry

## 2022-04-14 ENCOUNTER — Ambulatory Visit (INDEPENDENT_AMBULATORY_CARE_PROVIDER_SITE_OTHER): Payer: PPO

## 2022-04-14 VITALS — BP 134/72 | HR 74

## 2022-04-14 DIAGNOSIS — D2371 Other benign neoplasm of skin of right lower limb, including hip: Secondary | ICD-10-CM | POA: Diagnosis not present

## 2022-04-14 DIAGNOSIS — M2041 Other hammer toe(s) (acquired), right foot: Secondary | ICD-10-CM | POA: Diagnosis not present

## 2022-04-14 DIAGNOSIS — M7751 Other enthesopathy of right foot: Secondary | ICD-10-CM

## 2022-04-14 DIAGNOSIS — D2372 Other benign neoplasm of skin of left lower limb, including hip: Secondary | ICD-10-CM

## 2022-04-14 MED ORDER — DEXAMETHASONE SODIUM PHOSPHATE 120 MG/30ML IJ SOLN
2.0000 mg | Freq: Once | INTRAMUSCULAR | Status: AC
  Administered 2022-04-14: 2 mg via INTRA_ARTICULAR

## 2022-04-17 NOTE — Progress Notes (Signed)
She presents today chief complaint of a painful area the fourth digit of the right foot.  She is also complaining of painful callus bilaterally.  Objective: Vital signs stable alert and oriented x 3.  There is no erythema edema salines drainage or odor palpable bursitis fourth digit right foot.  And benign skin lesions.  Assessment: Benign skin lesions bilateral.  Bursitis fourth digit right foot.  Plan: Debrided benign skin lesions bilateral tolerated procedure well.  Injected the fourth digit with dexamethasone and local anesthetic follow-up with her as needed.

## 2022-04-21 ENCOUNTER — Ambulatory Visit: Payer: PPO

## 2022-04-22 ENCOUNTER — Ambulatory Visit
Admission: RE | Admit: 2022-04-22 | Discharge: 2022-04-22 | Disposition: A | Discharge status: Home / Self Care | Payer: PPO | Source: Ambulatory Visit | Attending: Family Medicine | Admitting: Family Medicine

## 2022-04-22 DIAGNOSIS — Z1231 Encounter for screening mammogram for malignant neoplasm of breast: Secondary | ICD-10-CM

## 2022-05-12 DIAGNOSIS — E785 Hyperlipidemia, unspecified: Secondary | ICD-10-CM | POA: Diagnosis not present

## 2022-05-12 DIAGNOSIS — R7989 Other specified abnormal findings of blood chemistry: Secondary | ICD-10-CM | POA: Diagnosis not present

## 2022-05-12 DIAGNOSIS — M85851 Other specified disorders of bone density and structure, right thigh: Secondary | ICD-10-CM | POA: Diagnosis not present

## 2022-05-18 DIAGNOSIS — Z6826 Body mass index (BMI) 26.0-26.9, adult: Secondary | ICD-10-CM | POA: Diagnosis not present

## 2022-05-18 DIAGNOSIS — Z0000 Encounter for general adult medical examination without abnormal findings: Secondary | ICD-10-CM | POA: Diagnosis not present

## 2022-05-18 DIAGNOSIS — M85851 Other specified disorders of bone density and structure, right thigh: Secondary | ICD-10-CM | POA: Diagnosis not present

## 2022-05-18 DIAGNOSIS — F411 Generalized anxiety disorder: Secondary | ICD-10-CM | POA: Diagnosis not present

## 2022-05-18 DIAGNOSIS — R7989 Other specified abnormal findings of blood chemistry: Secondary | ICD-10-CM | POA: Diagnosis not present

## 2022-06-15 DIAGNOSIS — M62830 Muscle spasm of back: Secondary | ICD-10-CM | POA: Diagnosis not present

## 2022-06-15 DIAGNOSIS — M6281 Muscle weakness (generalized): Secondary | ICD-10-CM | POA: Diagnosis not present

## 2022-08-12 DIAGNOSIS — M810 Age-related osteoporosis without current pathological fracture: Secondary | ICD-10-CM | POA: Diagnosis not present

## 2022-08-12 DIAGNOSIS — M1990 Unspecified osteoarthritis, unspecified site: Secondary | ICD-10-CM | POA: Diagnosis not present

## 2022-08-12 DIAGNOSIS — M8580 Other specified disorders of bone density and structure, unspecified site: Secondary | ICD-10-CM | POA: Diagnosis not present

## 2022-08-12 DIAGNOSIS — H3530 Unspecified macular degeneration: Secondary | ICD-10-CM | POA: Diagnosis not present

## 2022-11-17 DIAGNOSIS — E7841 Elevated Lipoprotein(a): Secondary | ICD-10-CM | POA: Diagnosis not present

## 2022-11-17 DIAGNOSIS — R7989 Other specified abnormal findings of blood chemistry: Secondary | ICD-10-CM | POA: Diagnosis not present

## 2022-11-22 ENCOUNTER — Other Ambulatory Visit (HOSPITAL_BASED_OUTPATIENT_CLINIC_OR_DEPARTMENT_OTHER): Payer: Self-pay | Admitting: Family Medicine

## 2022-11-22 DIAGNOSIS — R7989 Other specified abnormal findings of blood chemistry: Secondary | ICD-10-CM

## 2023-01-13 ENCOUNTER — Ambulatory Visit (HOSPITAL_BASED_OUTPATIENT_CLINIC_OR_DEPARTMENT_OTHER)
Admission: RE | Admit: 2023-01-13 | Discharge: 2023-01-13 | Disposition: A | Discharge status: Home / Self Care | Payer: PPO | Source: Ambulatory Visit | Attending: Family Medicine | Admitting: Family Medicine

## 2023-01-13 DIAGNOSIS — R7989 Other specified abnormal findings of blood chemistry: Secondary | ICD-10-CM | POA: Insufficient documentation

## 2023-02-13 DIAGNOSIS — L821 Other seborrheic keratosis: Secondary | ICD-10-CM | POA: Diagnosis not present

## 2023-02-13 DIAGNOSIS — D1801 Hemangioma of skin and subcutaneous tissue: Secondary | ICD-10-CM | POA: Diagnosis not present

## 2023-02-13 DIAGNOSIS — D229 Melanocytic nevi, unspecified: Secondary | ICD-10-CM | POA: Diagnosis not present

## 2023-02-13 DIAGNOSIS — L570 Actinic keratosis: Secondary | ICD-10-CM | POA: Diagnosis not present

## 2023-02-13 DIAGNOSIS — L814 Other melanin hyperpigmentation: Secondary | ICD-10-CM | POA: Diagnosis not present

## 2023-02-13 DIAGNOSIS — L578 Other skin changes due to chronic exposure to nonionizing radiation: Secondary | ICD-10-CM | POA: Diagnosis not present

## 2023-03-22 ENCOUNTER — Other Ambulatory Visit: Payer: Self-pay | Admitting: Family Medicine

## 2023-03-22 DIAGNOSIS — Z1231 Encounter for screening mammogram for malignant neoplasm of breast: Secondary | ICD-10-CM

## 2023-04-27 ENCOUNTER — Ambulatory Visit
Admission: RE | Admit: 2023-04-27 | Discharge: 2023-04-27 | Disposition: A | Discharge status: Home / Self Care | Payer: PPO | Source: Ambulatory Visit | Attending: Family Medicine | Admitting: Family Medicine

## 2023-04-27 DIAGNOSIS — Z1231 Encounter for screening mammogram for malignant neoplasm of breast: Secondary | ICD-10-CM

## 2023-08-16 ENCOUNTER — Other Ambulatory Visit: Payer: Self-pay | Admitting: Medical Genetics

## 2023-08-18 ENCOUNTER — Other Ambulatory Visit (HOSPITAL_COMMUNITY)
Admission: RE | Admit: 2023-08-18 | Discharge: 2023-08-18 | Disposition: A | Discharge status: Home / Self Care | Payer: Self-pay | Source: Ambulatory Visit | Attending: Medical Genetics | Admitting: Medical Genetics

## 2023-09-08 DIAGNOSIS — H353132 Nonexudative age-related macular degeneration, bilateral, intermediate dry stage: Secondary | ICD-10-CM | POA: Diagnosis not present

## 2023-09-08 LAB — GENECONNECT MOLECULAR SCREEN: Genetic Analysis Overall Interpretation: NEGATIVE

## 2023-11-27 DIAGNOSIS — Z1211 Encounter for screening for malignant neoplasm of colon: Secondary | ICD-10-CM | POA: Diagnosis not present

## 2023-11-27 DIAGNOSIS — D125 Benign neoplasm of sigmoid colon: Secondary | ICD-10-CM | POA: Diagnosis not present

## 2023-11-27 DIAGNOSIS — K648 Other hemorrhoids: Secondary | ICD-10-CM | POA: Diagnosis not present

## 2023-11-27 DIAGNOSIS — K5730 Diverticulosis of large intestine without perforation or abscess without bleeding: Secondary | ICD-10-CM | POA: Diagnosis not present

## 2023-11-29 DIAGNOSIS — D125 Benign neoplasm of sigmoid colon: Secondary | ICD-10-CM | POA: Diagnosis not present

## 2024-03-25 ENCOUNTER — Other Ambulatory Visit (HOSPITAL_BASED_OUTPATIENT_CLINIC_OR_DEPARTMENT_OTHER): Payer: Self-pay | Admitting: Family Medicine

## 2024-03-25 DIAGNOSIS — M85851 Other specified disorders of bone density and structure, right thigh: Secondary | ICD-10-CM

## 2024-03-29 ENCOUNTER — Other Ambulatory Visit: Payer: Self-pay | Admitting: Family Medicine

## 2024-03-29 DIAGNOSIS — Z1231 Encounter for screening mammogram for malignant neoplasm of breast: Secondary | ICD-10-CM

## 2024-04-29 ENCOUNTER — Other Ambulatory Visit: Payer: Self-pay | Admitting: Family Medicine

## 2024-04-29 ENCOUNTER — Inpatient Hospital Stay: Admission: RE | Admit: 2024-04-29 | Discharge: 2024-04-29 | Attending: Family Medicine | Admitting: Family Medicine

## 2024-04-29 DIAGNOSIS — Z1231 Encounter for screening mammogram for malignant neoplasm of breast: Secondary | ICD-10-CM

## 2024-07-22 ENCOUNTER — Other Ambulatory Visit (HOSPITAL_BASED_OUTPATIENT_CLINIC_OR_DEPARTMENT_OTHER)
